# Patient Record
Sex: Female | Born: 1945
Health system: Southern US, Community
[De-identification: ages and names within clinical notes are randomized; demographics above are authoritative.]

## PROBLEM LIST (undated history)

## (undated) DIAGNOSIS — E119 Type 2 diabetes mellitus without complications: Secondary | ICD-10-CM

## (undated) DIAGNOSIS — O2230 Deep phlebothrombosis in pregnancy, unspecified trimester: Secondary | ICD-10-CM

## (undated) HISTORY — DX: Deep phlebothrombosis in pregnancy, unspecified trimester: O22.30

## (undated) HISTORY — DX: Type 2 diabetes mellitus without complications: E11.9

## (undated) HISTORY — PX: CHOLECYSTECTOMY: SHX55

---

## 2019-04-07 DIAGNOSIS — E782 Mixed hyperlipidemia: Secondary | ICD-10-CM | POA: Diagnosis not present

## 2019-04-07 DIAGNOSIS — E1159 Type 2 diabetes mellitus with other circulatory complications: Secondary | ICD-10-CM | POA: Diagnosis not present

## 2019-04-15 DIAGNOSIS — E1169 Type 2 diabetes mellitus with other specified complication: Secondary | ICD-10-CM | POA: Diagnosis not present

## 2019-04-15 DIAGNOSIS — Z139 Encounter for screening, unspecified: Secondary | ICD-10-CM | POA: Diagnosis not present

## 2019-04-15 DIAGNOSIS — E782 Mixed hyperlipidemia: Secondary | ICD-10-CM | POA: Diagnosis not present

## 2019-04-15 DIAGNOSIS — E1159 Type 2 diabetes mellitus with other circulatory complications: Secondary | ICD-10-CM | POA: Diagnosis not present

## 2019-04-15 DIAGNOSIS — Z0000 Encounter for general adult medical examination without abnormal findings: Secondary | ICD-10-CM | POA: Diagnosis not present

## 2019-04-15 DIAGNOSIS — I10 Essential (primary) hypertension: Secondary | ICD-10-CM | POA: Diagnosis not present

## 2019-04-19 DIAGNOSIS — E1169 Type 2 diabetes mellitus with other specified complication: Secondary | ICD-10-CM | POA: Diagnosis not present

## 2019-04-19 DIAGNOSIS — I10 Essential (primary) hypertension: Secondary | ICD-10-CM | POA: Diagnosis not present

## 2019-04-19 DIAGNOSIS — E782 Mixed hyperlipidemia: Secondary | ICD-10-CM | POA: Diagnosis not present

## 2019-04-19 DIAGNOSIS — E1159 Type 2 diabetes mellitus with other circulatory complications: Secondary | ICD-10-CM | POA: Diagnosis not present

## 2019-06-10 DIAGNOSIS — E782 Mixed hyperlipidemia: Secondary | ICD-10-CM | POA: Diagnosis not present

## 2019-06-10 DIAGNOSIS — E1169 Type 2 diabetes mellitus with other specified complication: Secondary | ICD-10-CM | POA: Diagnosis not present

## 2019-06-10 DIAGNOSIS — I10 Essential (primary) hypertension: Secondary | ICD-10-CM | POA: Diagnosis not present

## 2019-06-10 DIAGNOSIS — E1159 Type 2 diabetes mellitus with other circulatory complications: Secondary | ICD-10-CM | POA: Diagnosis not present

## 2019-06-28 ENCOUNTER — Other Ambulatory Visit: Payer: Self-pay

## 2019-06-28 NOTE — Patient Outreach (Signed)
[facility]) Care Management  06/28/2019  Robin Nash 1946-09-12 [id number]   Medication Adherence call to [name] Compliant Voice message left with a call back number. [name] is showing past due on Pravastatin 40 mg under [name].  [place] Management Direct Dial [phone]  Fax [phone] Isom [email or web address]

## 2019-07-08 DIAGNOSIS — E1169 Type 2 diabetes mellitus with other specified complication: Secondary | ICD-10-CM | POA: Diagnosis not present

## 2019-07-08 DIAGNOSIS — E782 Mixed hyperlipidemia: Secondary | ICD-10-CM | POA: Diagnosis not present

## 2019-07-08 DIAGNOSIS — E1159 Type 2 diabetes mellitus with other circulatory complications: Secondary | ICD-10-CM | POA: Diagnosis not present

## 2019-07-11 DIAGNOSIS — E1159 Type 2 diabetes mellitus with other circulatory complications: Secondary | ICD-10-CM | POA: Diagnosis not present

## 2019-07-11 DIAGNOSIS — I10 Essential (primary) hypertension: Secondary | ICD-10-CM | POA: Diagnosis not present

## 2019-07-11 DIAGNOSIS — E782 Mixed hyperlipidemia: Secondary | ICD-10-CM | POA: Diagnosis not present

## 2019-07-11 DIAGNOSIS — E1169 Type 2 diabetes mellitus with other specified complication: Secondary | ICD-10-CM | POA: Diagnosis not present

## 2019-08-10 DIAGNOSIS — E782 Mixed hyperlipidemia: Secondary | ICD-10-CM | POA: Diagnosis not present

## 2019-08-10 DIAGNOSIS — E1169 Type 2 diabetes mellitus with other specified complication: Secondary | ICD-10-CM | POA: Diagnosis not present

## 2019-08-10 DIAGNOSIS — I10 Essential (primary) hypertension: Secondary | ICD-10-CM | POA: Diagnosis not present

## 2019-08-10 DIAGNOSIS — E1159 Type 2 diabetes mellitus with other circulatory complications: Secondary | ICD-10-CM | POA: Diagnosis not present

## 2019-09-09 DIAGNOSIS — E782 Mixed hyperlipidemia: Secondary | ICD-10-CM | POA: Diagnosis not present

## 2019-09-09 DIAGNOSIS — E1159 Type 2 diabetes mellitus with other circulatory complications: Secondary | ICD-10-CM | POA: Diagnosis not present

## 2019-09-09 DIAGNOSIS — E1169 Type 2 diabetes mellitus with other specified complication: Secondary | ICD-10-CM | POA: Diagnosis not present

## 2019-09-09 DIAGNOSIS — I10 Essential (primary) hypertension: Secondary | ICD-10-CM | POA: Diagnosis not present

## 2019-10-10 DIAGNOSIS — E1169 Type 2 diabetes mellitus with other specified complication: Secondary | ICD-10-CM | POA: Diagnosis not present

## 2019-10-10 DIAGNOSIS — E1159 Type 2 diabetes mellitus with other circulatory complications: Secondary | ICD-10-CM | POA: Diagnosis not present

## 2019-10-10 DIAGNOSIS — I10 Essential (primary) hypertension: Secondary | ICD-10-CM | POA: Diagnosis not present

## 2019-10-10 DIAGNOSIS — E782 Mixed hyperlipidemia: Secondary | ICD-10-CM | POA: Diagnosis not present

## 2019-11-09 DIAGNOSIS — I10 Essential (primary) hypertension: Secondary | ICD-10-CM | POA: Diagnosis not present

## 2019-11-09 DIAGNOSIS — E1169 Type 2 diabetes mellitus with other specified complication: Secondary | ICD-10-CM | POA: Diagnosis not present

## 2019-11-09 DIAGNOSIS — E782 Mixed hyperlipidemia: Secondary | ICD-10-CM | POA: Diagnosis not present

## 2019-11-09 DIAGNOSIS — E1159 Type 2 diabetes mellitus with other circulatory complications: Secondary | ICD-10-CM | POA: Diagnosis not present

## 2019-12-06 DIAGNOSIS — E1169 Type 2 diabetes mellitus with other specified complication: Secondary | ICD-10-CM | POA: Diagnosis not present

## 2019-12-09 DIAGNOSIS — E1169 Type 2 diabetes mellitus with other specified complication: Secondary | ICD-10-CM | POA: Diagnosis not present

## 2019-12-09 DIAGNOSIS — E1159 Type 2 diabetes mellitus with other circulatory complications: Secondary | ICD-10-CM | POA: Diagnosis not present

## 2019-12-09 DIAGNOSIS — I10 Essential (primary) hypertension: Secondary | ICD-10-CM | POA: Diagnosis not present

## 2019-12-09 DIAGNOSIS — E782 Mixed hyperlipidemia: Secondary | ICD-10-CM | POA: Diagnosis not present

## 2019-12-12 DIAGNOSIS — E1159 Type 2 diabetes mellitus with other circulatory complications: Secondary | ICD-10-CM | POA: Diagnosis not present

## 2019-12-12 DIAGNOSIS — E782 Mixed hyperlipidemia: Secondary | ICD-10-CM | POA: Diagnosis not present

## 2019-12-12 DIAGNOSIS — E1169 Type 2 diabetes mellitus with other specified complication: Secondary | ICD-10-CM | POA: Diagnosis not present

## 2019-12-12 DIAGNOSIS — I152 Hypertension secondary to endocrine disorders: Secondary | ICD-10-CM | POA: Diagnosis not present

## 2020-01-08 DIAGNOSIS — E782 Mixed hyperlipidemia: Secondary | ICD-10-CM | POA: Diagnosis not present

## 2020-01-08 DIAGNOSIS — E1169 Type 2 diabetes mellitus with other specified complication: Secondary | ICD-10-CM | POA: Diagnosis not present

## 2020-01-08 DIAGNOSIS — I152 Hypertension secondary to endocrine disorders: Secondary | ICD-10-CM | POA: Diagnosis not present

## 2020-01-08 DIAGNOSIS — E1159 Type 2 diabetes mellitus with other circulatory complications: Secondary | ICD-10-CM | POA: Diagnosis not present

## 2020-01-23 DIAGNOSIS — Z1231 Encounter for screening mammogram for malignant neoplasm of breast: Secondary | ICD-10-CM | POA: Diagnosis not present

## 2020-02-08 DIAGNOSIS — E1169 Type 2 diabetes mellitus with other specified complication: Secondary | ICD-10-CM | POA: Diagnosis not present

## 2020-02-08 DIAGNOSIS — E1159 Type 2 diabetes mellitus with other circulatory complications: Secondary | ICD-10-CM | POA: Diagnosis not present

## 2020-02-08 DIAGNOSIS — E782 Mixed hyperlipidemia: Secondary | ICD-10-CM | POA: Diagnosis not present

## 2020-02-08 DIAGNOSIS — I152 Hypertension secondary to endocrine disorders: Secondary | ICD-10-CM | POA: Diagnosis not present

## 2020-03-09 DIAGNOSIS — E782 Mixed hyperlipidemia: Secondary | ICD-10-CM | POA: Diagnosis not present

## 2020-03-09 DIAGNOSIS — E1169 Type 2 diabetes mellitus with other specified complication: Secondary | ICD-10-CM | POA: Diagnosis not present

## 2020-03-09 DIAGNOSIS — I152 Hypertension secondary to endocrine disorders: Secondary | ICD-10-CM | POA: Diagnosis not present

## 2020-03-09 DIAGNOSIS — E1159 Type 2 diabetes mellitus with other circulatory complications: Secondary | ICD-10-CM | POA: Diagnosis not present

## 2020-03-12 DIAGNOSIS — Z0100 Encounter for examination of eyes and vision without abnormal findings: Secondary | ICD-10-CM | POA: Diagnosis not present

## 2020-03-12 DIAGNOSIS — E119 Type 2 diabetes mellitus without complications: Secondary | ICD-10-CM | POA: Diagnosis not present

## 2020-03-13 DIAGNOSIS — Z7984 Long term (current) use of oral hypoglycemic drugs: Secondary | ICD-10-CM | POA: Diagnosis not present

## 2020-03-13 DIAGNOSIS — E119 Type 2 diabetes mellitus without complications: Secondary | ICD-10-CM | POA: Diagnosis not present

## 2020-03-13 DIAGNOSIS — H2513 Age-related nuclear cataract, bilateral: Secondary | ICD-10-CM | POA: Diagnosis not present

## 2020-03-30 DIAGNOSIS — E1159 Type 2 diabetes mellitus with other circulatory complications: Secondary | ICD-10-CM | POA: Diagnosis not present

## 2020-03-30 DIAGNOSIS — E782 Mixed hyperlipidemia: Secondary | ICD-10-CM | POA: Diagnosis not present

## 2020-04-09 DIAGNOSIS — E782 Mixed hyperlipidemia: Secondary | ICD-10-CM | POA: Diagnosis not present

## 2020-04-09 DIAGNOSIS — E1159 Type 2 diabetes mellitus with other circulatory complications: Secondary | ICD-10-CM | POA: Diagnosis not present

## 2020-04-09 DIAGNOSIS — E1169 Type 2 diabetes mellitus with other specified complication: Secondary | ICD-10-CM | POA: Diagnosis not present

## 2020-04-09 DIAGNOSIS — I152 Hypertension secondary to endocrine disorders: Secondary | ICD-10-CM | POA: Diagnosis not present

## 2020-04-12 DIAGNOSIS — Z136 Encounter for screening for cardiovascular disorders: Secondary | ICD-10-CM | POA: Diagnosis not present

## 2020-04-12 DIAGNOSIS — E1159 Type 2 diabetes mellitus with other circulatory complications: Secondary | ICD-10-CM | POA: Diagnosis not present

## 2020-04-12 DIAGNOSIS — E1169 Type 2 diabetes mellitus with other specified complication: Secondary | ICD-10-CM | POA: Diagnosis not present

## 2020-04-12 DIAGNOSIS — Z0000 Encounter for general adult medical examination without abnormal findings: Secondary | ICD-10-CM | POA: Diagnosis not present

## 2020-04-12 DIAGNOSIS — I152 Hypertension secondary to endocrine disorders: Secondary | ICD-10-CM | POA: Diagnosis not present

## 2020-04-12 DIAGNOSIS — E782 Mixed hyperlipidemia: Secondary | ICD-10-CM | POA: Diagnosis not present

## 2020-04-12 DIAGNOSIS — Z7189 Other specified counseling: Secondary | ICD-10-CM | POA: Diagnosis not present

## 2020-04-12 DIAGNOSIS — Z139 Encounter for screening, unspecified: Secondary | ICD-10-CM | POA: Diagnosis not present

## 2020-05-09 DIAGNOSIS — I152 Hypertension secondary to endocrine disorders: Secondary | ICD-10-CM | POA: Diagnosis not present

## 2020-05-09 DIAGNOSIS — E1169 Type 2 diabetes mellitus with other specified complication: Secondary | ICD-10-CM | POA: Diagnosis not present

## 2020-05-09 DIAGNOSIS — E782 Mixed hyperlipidemia: Secondary | ICD-10-CM | POA: Diagnosis not present

## 2020-05-09 DIAGNOSIS — E1159 Type 2 diabetes mellitus with other circulatory complications: Secondary | ICD-10-CM | POA: Diagnosis not present

## 2020-06-10 DIAGNOSIS — E1169 Type 2 diabetes mellitus with other specified complication: Secondary | ICD-10-CM | POA: Diagnosis not present

## 2020-06-10 DIAGNOSIS — E1159 Type 2 diabetes mellitus with other circulatory complications: Secondary | ICD-10-CM | POA: Diagnosis not present

## 2020-06-10 DIAGNOSIS — I152 Hypertension secondary to endocrine disorders: Secondary | ICD-10-CM | POA: Diagnosis not present

## 2020-06-10 DIAGNOSIS — E782 Mixed hyperlipidemia: Secondary | ICD-10-CM | POA: Diagnosis not present

## 2020-07-10 DIAGNOSIS — I152 Hypertension secondary to endocrine disorders: Secondary | ICD-10-CM | POA: Diagnosis not present

## 2020-07-10 DIAGNOSIS — E1159 Type 2 diabetes mellitus with other circulatory complications: Secondary | ICD-10-CM | POA: Diagnosis not present

## 2020-07-10 DIAGNOSIS — E1169 Type 2 diabetes mellitus with other specified complication: Secondary | ICD-10-CM | POA: Diagnosis not present

## 2020-07-10 DIAGNOSIS — E782 Mixed hyperlipidemia: Secondary | ICD-10-CM | POA: Diagnosis not present

## 2020-07-11 DIAGNOSIS — E1169 Type 2 diabetes mellitus with other specified complication: Secondary | ICD-10-CM | POA: Diagnosis not present

## 2020-07-11 DIAGNOSIS — I152 Hypertension secondary to endocrine disorders: Secondary | ICD-10-CM | POA: Diagnosis not present

## 2020-07-11 DIAGNOSIS — E782 Mixed hyperlipidemia: Secondary | ICD-10-CM | POA: Diagnosis not present

## 2020-07-11 DIAGNOSIS — E1159 Type 2 diabetes mellitus with other circulatory complications: Secondary | ICD-10-CM | POA: Diagnosis not present

## 2020-07-12 DIAGNOSIS — E1169 Type 2 diabetes mellitus with other specified complication: Secondary | ICD-10-CM | POA: Diagnosis not present

## 2020-07-23 DIAGNOSIS — N1831 Chronic kidney disease, stage 3a: Secondary | ICD-10-CM | POA: Diagnosis not present

## 2020-07-23 DIAGNOSIS — E1169 Type 2 diabetes mellitus with other specified complication: Secondary | ICD-10-CM | POA: Diagnosis not present

## 2020-07-23 DIAGNOSIS — I152 Hypertension secondary to endocrine disorders: Secondary | ICD-10-CM | POA: Diagnosis not present

## 2020-07-23 DIAGNOSIS — E1159 Type 2 diabetes mellitus with other circulatory complications: Secondary | ICD-10-CM | POA: Diagnosis not present

## 2020-07-23 DIAGNOSIS — E782 Mixed hyperlipidemia: Secondary | ICD-10-CM | POA: Diagnosis not present

## 2020-08-10 DIAGNOSIS — E782 Mixed hyperlipidemia: Secondary | ICD-10-CM | POA: Diagnosis not present

## 2020-08-10 DIAGNOSIS — E1169 Type 2 diabetes mellitus with other specified complication: Secondary | ICD-10-CM | POA: Diagnosis not present

## 2020-08-10 DIAGNOSIS — I152 Hypertension secondary to endocrine disorders: Secondary | ICD-10-CM | POA: Diagnosis not present

## 2020-08-10 DIAGNOSIS — E1159 Type 2 diabetes mellitus with other circulatory complications: Secondary | ICD-10-CM | POA: Diagnosis not present

## 2020-09-10 DIAGNOSIS — E1159 Type 2 diabetes mellitus with other circulatory complications: Secondary | ICD-10-CM | POA: Diagnosis not present

## 2020-09-10 DIAGNOSIS — I152 Hypertension secondary to endocrine disorders: Secondary | ICD-10-CM | POA: Diagnosis not present

## 2020-09-10 DIAGNOSIS — E1169 Type 2 diabetes mellitus with other specified complication: Secondary | ICD-10-CM | POA: Diagnosis not present

## 2020-09-10 DIAGNOSIS — E782 Mixed hyperlipidemia: Secondary | ICD-10-CM | POA: Diagnosis not present

## 2020-10-10 DIAGNOSIS — E782 Mixed hyperlipidemia: Secondary | ICD-10-CM | POA: Diagnosis not present

## 2020-10-10 DIAGNOSIS — E1159 Type 2 diabetes mellitus with other circulatory complications: Secondary | ICD-10-CM | POA: Diagnosis not present

## 2020-10-10 DIAGNOSIS — I152 Hypertension secondary to endocrine disorders: Secondary | ICD-10-CM | POA: Diagnosis not present

## 2020-10-10 DIAGNOSIS — E1169 Type 2 diabetes mellitus with other specified complication: Secondary | ICD-10-CM | POA: Diagnosis not present

## 2020-11-08 DIAGNOSIS — U071 COVID-19: Secondary | ICD-10-CM | POA: Diagnosis not present

## 2020-11-08 DIAGNOSIS — Z20822 Contact with and (suspected) exposure to covid-19: Secondary | ICD-10-CM | POA: Diagnosis not present

## 2020-12-11 DIAGNOSIS — N1831 Chronic kidney disease, stage 3a: Secondary | ICD-10-CM | POA: Diagnosis not present

## 2020-12-11 DIAGNOSIS — I152 Hypertension secondary to endocrine disorders: Secondary | ICD-10-CM | POA: Diagnosis not present

## 2020-12-11 DIAGNOSIS — E782 Mixed hyperlipidemia: Secondary | ICD-10-CM | POA: Diagnosis not present

## 2020-12-11 DIAGNOSIS — E1169 Type 2 diabetes mellitus with other specified complication: Secondary | ICD-10-CM | POA: Diagnosis not present

## 2021-01-08 DIAGNOSIS — I152 Hypertension secondary to endocrine disorders: Secondary | ICD-10-CM | POA: Diagnosis not present

## 2021-01-08 DIAGNOSIS — E1169 Type 2 diabetes mellitus with other specified complication: Secondary | ICD-10-CM | POA: Diagnosis not present

## 2021-01-08 DIAGNOSIS — N1831 Chronic kidney disease, stage 3a: Secondary | ICD-10-CM | POA: Diagnosis not present

## 2021-01-08 DIAGNOSIS — E782 Mixed hyperlipidemia: Secondary | ICD-10-CM | POA: Diagnosis not present

## 2021-02-08 DIAGNOSIS — E1159 Type 2 diabetes mellitus with other circulatory complications: Secondary | ICD-10-CM | POA: Diagnosis not present

## 2021-02-08 DIAGNOSIS — E1169 Type 2 diabetes mellitus with other specified complication: Secondary | ICD-10-CM | POA: Diagnosis not present

## 2021-02-08 DIAGNOSIS — E785 Hyperlipidemia, unspecified: Secondary | ICD-10-CM | POA: Diagnosis not present

## 2021-03-10 DIAGNOSIS — E1169 Type 2 diabetes mellitus with other specified complication: Secondary | ICD-10-CM | POA: Diagnosis not present

## 2021-03-10 DIAGNOSIS — E782 Mixed hyperlipidemia: Secondary | ICD-10-CM | POA: Diagnosis not present

## 2021-03-10 DIAGNOSIS — E1159 Type 2 diabetes mellitus with other circulatory complications: Secondary | ICD-10-CM | POA: Diagnosis not present

## 2021-03-14 DIAGNOSIS — E1169 Type 2 diabetes mellitus with other specified complication: Secondary | ICD-10-CM | POA: Diagnosis not present

## 2021-03-18 DIAGNOSIS — R748 Abnormal levels of other serum enzymes: Secondary | ICD-10-CM | POA: Diagnosis not present

## 2021-03-18 DIAGNOSIS — E1169 Type 2 diabetes mellitus with other specified complication: Secondary | ICD-10-CM | POA: Diagnosis not present

## 2021-03-18 DIAGNOSIS — E782 Mixed hyperlipidemia: Secondary | ICD-10-CM | POA: Diagnosis not present

## 2021-03-18 DIAGNOSIS — N1831 Chronic kidney disease, stage 3a: Secondary | ICD-10-CM | POA: Diagnosis not present

## 2021-03-18 DIAGNOSIS — R1011 Right upper quadrant pain: Secondary | ICD-10-CM | POA: Diagnosis not present

## 2021-03-21 DIAGNOSIS — R109 Unspecified abdominal pain: Secondary | ICD-10-CM | POA: Diagnosis not present

## 2021-03-21 DIAGNOSIS — R1011 Right upper quadrant pain: Secondary | ICD-10-CM | POA: Diagnosis not present

## 2021-03-28 DIAGNOSIS — R748 Abnormal levels of other serum enzymes: Secondary | ICD-10-CM | POA: Diagnosis not present

## 2021-03-28 DIAGNOSIS — R160 Hepatomegaly, not elsewhere classified: Secondary | ICD-10-CM | POA: Diagnosis not present

## 2021-03-28 DIAGNOSIS — I959 Hypotension, unspecified: Secondary | ICD-10-CM | POA: Diagnosis not present

## 2021-03-28 DIAGNOSIS — E1169 Type 2 diabetes mellitus with other specified complication: Secondary | ICD-10-CM | POA: Diagnosis not present

## 2021-04-02 DIAGNOSIS — K8689 Other specified diseases of pancreas: Secondary | ICD-10-CM | POA: Diagnosis not present

## 2021-04-02 DIAGNOSIS — K7689 Other specified diseases of liver: Secondary | ICD-10-CM | POA: Diagnosis not present

## 2021-04-02 DIAGNOSIS — R109 Unspecified abdominal pain: Secondary | ICD-10-CM | POA: Diagnosis not present

## 2021-04-02 DIAGNOSIS — R160 Hepatomegaly, not elsewhere classified: Secondary | ICD-10-CM | POA: Diagnosis not present

## 2021-04-02 DIAGNOSIS — C799 Secondary malignant neoplasm of unspecified site: Secondary | ICD-10-CM | POA: Diagnosis not present

## 2021-04-04 ENCOUNTER — Telehealth: Payer: Self-pay

## 2021-04-04 DIAGNOSIS — R160 Hepatomegaly, not elsewhere classified: Secondary | ICD-10-CM | POA: Diagnosis not present

## 2021-04-04 DIAGNOSIS — I824Z1 Acute embolism and thrombosis of unspecified deep veins of right distal lower extremity: Secondary | ICD-10-CM | POA: Diagnosis not present

## 2021-04-04 DIAGNOSIS — I82441 Acute embolism and thrombosis of right tibial vein: Secondary | ICD-10-CM | POA: Diagnosis not present

## 2021-04-04 DIAGNOSIS — Z6822 Body mass index (BMI) 22.0-22.9, adult: Secondary | ICD-10-CM | POA: Diagnosis not present

## 2021-04-04 DIAGNOSIS — R978 Other abnormal tumor markers: Secondary | ICD-10-CM | POA: Diagnosis not present

## 2021-04-04 DIAGNOSIS — M79661 Pain in right lower leg: Secondary | ICD-10-CM | POA: Diagnosis not present

## 2021-04-04 DIAGNOSIS — R970 Elevated carcinoembryonic antigen [CEA]: Secondary | ICD-10-CM | POA: Diagnosis not present

## 2021-04-04 DIAGNOSIS — R600 Localized edema: Secondary | ICD-10-CM | POA: Diagnosis not present

## 2021-04-04 DIAGNOSIS — K8689 Other specified diseases of pancreas: Secondary | ICD-10-CM | POA: Diagnosis not present

## 2021-04-04 DIAGNOSIS — M7989 Other specified soft tissue disorders: Secondary | ICD-10-CM | POA: Diagnosis not present

## 2021-04-04 NOTE — Telephone Encounter (Signed)
LVM. Appointment for patient is 6-2 at 830am in [facility] and that she needs to arrive at 815

## 2021-04-05 NOTE — Telephone Encounter (Signed)
Patient called back and is aware of appt information.

## 2021-04-10 DIAGNOSIS — E782 Mixed hyperlipidemia: Secondary | ICD-10-CM | POA: Diagnosis not present

## 2021-04-10 DIAGNOSIS — E1169 Type 2 diabetes mellitus with other specified complication: Secondary | ICD-10-CM | POA: Diagnosis not present

## 2021-04-10 DIAGNOSIS — N1831 Chronic kidney disease, stage 3a: Secondary | ICD-10-CM | POA: Diagnosis not present

## 2021-04-11 ENCOUNTER — Ambulatory Visit (INDEPENDENT_AMBULATORY_CARE_PROVIDER_SITE_OTHER): Payer: Medicare HMO | Admitting: Gastroenterology

## 2021-04-11 ENCOUNTER — Encounter: Payer: Self-pay | Admitting: Gastroenterology

## 2021-04-11 VITALS — BP 132/72 | HR 88 | Ht 64.0 in | Wt 140.5 lb

## 2021-04-11 DIAGNOSIS — I8290 Acute embolism and thrombosis of unspecified vein: Secondary | ICD-10-CM

## 2021-04-11 DIAGNOSIS — C787 Secondary malignant neoplasm of liver and intrahepatic bile duct: Secondary | ICD-10-CM | POA: Diagnosis not present

## 2021-04-11 DIAGNOSIS — E1169 Type 2 diabetes mellitus with other specified complication: Secondary | ICD-10-CM | POA: Diagnosis not present

## 2021-04-11 DIAGNOSIS — C259 Malignant neoplasm of pancreas, unspecified: Secondary | ICD-10-CM | POA: Diagnosis not present

## 2021-04-11 DIAGNOSIS — I82409 Acute embolism and thrombosis of unspecified deep veins of unspecified lower extremity: Secondary | ICD-10-CM | POA: Diagnosis not present

## 2021-04-11 NOTE — Progress Notes (Signed)
Chief Complaint: Abnormal MRI  Referring Provider:  Marco Collie, MD      ASSESSMENT AND PLAN;   #1. Likely Stage IV pancreatic Ca (junction of body/tail of pancreas) with liver/LN mets.  Markedly elevated CA 19-9.  No biliary obstruction.  #2.  New onset DVT on Xeralo  Plan: -IR guided liver Bx ASAP, off Xeralto x 48hrs (CKD3), if/when OK with Dr Nyra Capes.  Not sure if she would benefit from IVC filter placement. -For oncology, once liver Bx is back, she would like to see Dr Bobby Rumpf at [place] of on [facility].  I have called Alfredo Bach (pt daughter) and have discussed the above findings extensively.  We have also discussed further work-up, poor prognosis and further treatment.  Fortunately, Altha Harm is in process of moving to [place] from [place].  I am certain that will help Bernisha a lot. I have also discussed with Dr. Marco Collie over text messaging. HPI:    Robin Nash is a 75 y.o. female  Seen as emergency workin for abn MRI. With HTN, HLD, DM2, DVT, CKD3  Had new onset abn LFTs with disproportionately increased alk phos (417, AST 41, ALT 59, TB 0.4) 03/20/2021 prompting RUQ US showing heterogeneous liver mass followed by MRI abdomen with contrast showing -Numerous aggressive appearing hepatic lesions likely metastatic disease -Poorly defined hypervascular lesion at the junction of body/tail of pancreas with prominent PD dilatation highly s/o primary pancreatic neoplasm. -Portacaval LNs suspicious for metastatic LN disease.  Her CA 19-9 was significantly elevated at 160070 U/ml (N 0-35) and CEA 108 (N 0-4.7).  Unfortunately, had leg swelling requiring Doppler ultrasound last week 04/04/2021 which is positive for right DVT.  She has been started on Xarelto.  She also complains of left leg swelling.  No SOB  No jaundice dark urine or pale stools.  CBD was normal on MRI.  She had had profound weight loss of over 40lb over last 3 mts.  She  also has new onset DM a year ago.  No nausea, vomiting, heartburn, regurgitation, odynophagia or dysphagia.  No significant diarrhea or constipation. No melena or hematochezia. No abdominal pain.    Past GI work-up: (Followed by Dr. Marian Sorrow in [place]) -Colon 03/2013 [facility], Dr Marian Sorrow): small 3 mm polyp s/p polypectomy.  Otherwise normal. -S/P lap chole 03/2016  Past Medical History:  Diagnosis Date  . Diabetes ([place])   . DVT (deep vein thrombosis) in pregnancy     Family History  Problem Relation Age of Onset  . Heart disease Brother   . Diabetes Maternal Aunt   . Colon cancer Neg Hx   . Esophageal cancer Neg Hx   . Liver disease Neg Hx     Social History   Tobacco Use  . Smoking status: Former Smoker    Years: 45.00  . Smokeless tobacco: Never Used  Vaping Use  . Vaping Use: Never used  Substance Use Topics  . Alcohol use: Yes    Comment: occ wine  . Drug use: Never    Current Outpatient Medications  Medication Sig Dispense Refill  . acetaminophen (TYLENOL) 650 MG CR tablet Take 650 mg by mouth as needed for pain.    Marland Kitchen telmisartan (MICARDIS) 20 MG tablet Take 20 mg by mouth daily.    . pravastatin (PRAVACHOL) 40 MG tablet Take 40 mg by mouth daily.     No current facility-administered medications for this visit.    No Known Allergies  Review  of Systems:  Constitutional: Denies fever, chills, diaphoresis, appetite change and has fatigue.  HEENT: Negative.   Respiratory: Denies SOB, DOE, cough, chest tightness,  and wheezing.   Cardiovascular: Denies chest pain, palpitations and leg swelling.  Genitourinary: Denies dysuria, urgency, frequency, hematuria, flank pain and difficulty urinating.  Musculoskeletal: Denies myalgias, back pain, joint swelling, arthralgias and gait problem.  Skin: No rash.  Neurological: Denies dizziness, seizures, syncope, weakness, light-headedness, numbness and headaches.  Hematological: Denies adenopathy. Easy bruising, personal  or family bleeding history  Psychiatric/Behavioral: No anxiety or depression     Physical Exam:    BP 132/72   Pulse 88   Ht 5' 4" (1.626 m)   Wt 140 lb 8 oz (63.7 kg)   SpO2 98%   BMI 24.12 kg/m  Wt Readings from Last 3 Encounters:  04/11/21 140 lb 8 oz (63.7 kg)   Constitutional: Thin built, in no acute distress. Psychiatric: Normal mood and affect. Behavior is normal. HEENT: Pupils normal.  Conjunctivae are normal. No scleral icterus. Cardiovascular: Normal rate, regular rhythm. No edema Pulmonary/chest: Effort normal and breath sounds normal. No wheezing, rales or rhonchi. Abdominal: Soft, nondistended. Nontender. Bowel sounds active throughout. There are no masses palpable. No hepatomegaly. Rectal: Deferred Neurological: Alert and oriented to person place and time. Skin: Skin is warm and dry. No rashes noted.  Data Reviewed: I have personally reviewed following labs and imaging studies  Labs reviewed -03/20/2021: Glucose 153, creatinine 1.09 with GFR 54 mL/min.  Sodium 143 potassium 3.6 calcium 10.1 albumin 4.3 total bilirubin 0.4, alkaline phosphatase 417, AST 41, ALT 59, GGT 757 -Amylase 71 lipase 56 -WBC count 10.9, hemoglobin 12.5, MCV 87, platelets 300 K -CA 19-9 160 K, CEA 108   Carmell Austria, MD 04/11/2021, 8:51 AM Cc: Marco Collie, MD

## 2021-04-11 NOTE — Patient Instructions (Signed)
If you are age 74 or older, your body mass index should be between 23-30. Your Body mass index is 24.12 kg/m. If this is out of the aforementioned range listed, please consider follow up with your Primary Care Provider.  If you are age 69 or younger, your body mass index should be between 19-25. Your Body mass index is 24.12 kg/m. If this is out of the aformentioned range listed, please consider follow up with your Primary Care Provider.   __________________________________________________________  The [facility] providers would like to encourage you to use [facility] to communicate with providers for non-urgent requests or questions.  Due to long hold times on the telephone, sending your provider a message by [facility] may be a faster and more efficient way to get a response.  Please allow 48 business hours for a response.  Please remember that this is for non-urgent requests.   We will need to get a clearance from your PCP before we can do your Liver Biopsy.  Please call us in 1 week if you haven't heard anything from [place] at [phone].  Thank you,  Dr. [name]

## 2021-04-12 ENCOUNTER — Encounter (HOSPITAL_COMMUNITY): Payer: Self-pay | Admitting: Radiology

## 2021-04-12 ENCOUNTER — Other Ambulatory Visit: Payer: Self-pay | Admitting: Radiology

## 2021-04-12 NOTE — Progress Notes (Signed)
Robin Nash Female, 75 y.o., 1946-04-23  MRN:  [id number] Phone:  [phone] (H)       PCP:  Marco Collie, MD Coverage:  [facility] Medicare/[employer] Medicare Hmo  Next Appt With Radiology ([facility]-US 2) 04/15/2021 at 1:00 PM           RE: [place] CORE BIOPSY (LIVER) Received: Virgia Land, MD  Bennye Alm,   -It should be okay to hold Xarelto for 2 doses before. Let me run it by Dr. Marco Collie as well. I got few more lab results and her GFR is 57 mL/min.   -Also she may benefit from IVC filter placement. Let me run that by Dr. Nyra Capes as well. Not sure if it can be done at the time of liver biopsy.   -Please send liver biopsy as [facility]. So we can have results sooner.   -Also send copy of liver biopsy to Dr. Lavera Guise (oncology at [facility])   Thanks for your help.   RG        Previous Messages   ----- Message -----  From: [name] D  Sent: 04/11/2021  1:06 PM EDT  To: [name], MD  Subject: FW: [place] CORE BIOPSY (LIVER)            Patient is on Xarelto and will need to hold two doses which end up being just one day of no Xarelto prior to her biopsy. Please advise if it is okay to hold. Thanks Aniceto Boss  ----- Message -----  From: [name] D  Sent: 04/11/2021  1:04 PM EDT  To: Ir Procedure Requests  Subject: [place] CORE BIOPSY (LIVER)              Procedure: [place] CORE BIOPSY (LIVER)   Reason: Deep vein thrombosis (DVT) of non-extremity vein, unspecified chronicity  Malignant neoplasm of pancreas, unspecified location of malignancy, pancreatic cancer, DVT,  Also do a IR guided ultrasound liver biospy please   History: outside imaging in PAC's, done at [facility]   Provider: [name]   Provider Contact:  [phone]

## 2021-04-12 NOTE — Progress Notes (Signed)
Robin Nash. [place] Female, 75 y.o., 05/31/1946  MRN:  [id number] Phone:  [phone] (H)       PCP:  Marco Collie, MD Coverage:  [facility] Medicare/[employer] Medicare Hmo  Next Appt With Radiology ([facility]-US 2) 04/15/2021 at 1:00 PM           RE: [place] CORE BIOPSY (LIVER) Received: [name], MD  [name]   [place] core liver lesion  R/o met panc ca   DDH        Previous Messages   ----- Message -----  From: [name] D  Sent: 04/11/2021  1:04 PM EDT  To: Ir Procedure Requests  Subject: [place] CORE BIOPSY (LIVER)              Procedure: [place] CORE BIOPSY (LIVER)   Reason: Deep vein thrombosis (DVT) of non-extremity vein, unspecified chronicity  Malignant neoplasm of pancreas, unspecified location of malignancy, pancreatic cancer, DVT,  Also do a IR guided ultrasound liver biospy please   History: outside imaging in PAC's, done at [facility]   Provider: [name]   Provider Contact:  [phone]

## 2021-04-15 ENCOUNTER — Encounter (HOSPITAL_COMMUNITY): Payer: Self-pay

## 2021-04-15 ENCOUNTER — Ambulatory Visit (HOSPITAL_COMMUNITY)
Admission: RE | Admit: 2021-04-15 | Discharge: 2021-04-15 | Disposition: A | Discharge status: Home / Self Care | Payer: Medicare HMO | Source: Ambulatory Visit | Attending: Gastroenterology | Admitting: Gastroenterology

## 2021-04-15 ENCOUNTER — Other Ambulatory Visit: Payer: Self-pay

## 2021-04-15 DIAGNOSIS — K7689 Other specified diseases of liver: Secondary | ICD-10-CM | POA: Diagnosis not present

## 2021-04-15 DIAGNOSIS — C259 Malignant neoplasm of pancreas, unspecified: Secondary | ICD-10-CM

## 2021-04-15 DIAGNOSIS — C787 Secondary malignant neoplasm of liver and intrahepatic bile duct: Secondary | ICD-10-CM | POA: Insufficient documentation

## 2021-04-15 DIAGNOSIS — I8290 Acute embolism and thrombosis of unspecified vein: Secondary | ICD-10-CM | POA: Diagnosis not present

## 2021-04-15 DIAGNOSIS — C229 Malignant neoplasm of liver, not specified as primary or secondary: Secondary | ICD-10-CM | POA: Diagnosis not present

## 2021-04-15 LAB — CBC WITH DIFFERENTIAL/PLATELET
Abs Immature Granulocytes: 0.11 10*3/uL — ABNORMAL HIGH (ref 0.00–0.07)
Basophils Absolute: 0.1 10*3/uL (ref 0.0–0.1)
Basophils Relative: 0 %
Eosinophils Absolute: 0.2 10*3/uL (ref 0.0–0.5)
Eosinophils Relative: 1 %
HCT: 32.1 % — ABNORMAL LOW (ref 36.0–46.0)
Hemoglobin: 10.1 g/dL — ABNORMAL LOW (ref 12.0–15.0)
Immature Granulocytes: 1 %
Lymphocytes Relative: 17 %
Lymphs Abs: 2.9 10*3/uL (ref 0.7–4.0)
MCH: 26.5 pg (ref 26.0–34.0)
MCHC: 31.5 g/dL (ref 30.0–36.0)
MCV: 84.3 fL (ref 80.0–100.0)
Monocytes Absolute: 1.7 10*3/uL — ABNORMAL HIGH (ref 0.1–1.0)
Monocytes Relative: 10 %
Neutro Abs: 12.2 10*3/uL — ABNORMAL HIGH (ref 1.7–7.7)
Neutrophils Relative %: 71 %
Platelets: 341 10*3/uL (ref 150–400)
RBC: 3.81 MIL/uL — ABNORMAL LOW (ref 3.87–5.11)
RDW: 16.2 % — ABNORMAL HIGH (ref 11.5–15.5)
WBC: 17.2 10*3/uL — ABNORMAL HIGH (ref 4.0–10.5)
nRBC: 0 % (ref 0.0–0.2)

## 2021-04-15 LAB — PROTIME-INR
INR: 1.2 (ref 0.8–1.2)
Prothrombin Time: 14.7 seconds (ref 11.4–15.2)

## 2021-04-15 LAB — COMPREHENSIVE METABOLIC PANEL
ALT: 57 U/L — ABNORMAL HIGH (ref 0–44)
AST: 48 U/L — ABNORMAL HIGH (ref 15–41)
Albumin: 3.5 g/dL (ref 3.5–5.0)
Alkaline Phosphatase: 323 U/L — ABNORMAL HIGH (ref 38–126)
Anion gap: 11 (ref 5–15)
BUN: 15 mg/dL (ref 8–23)
CO2: 23 mmol/L (ref 22–32)
Calcium: 9.8 mg/dL (ref 8.9–10.3)
Chloride: 105 mmol/L (ref 98–111)
Creatinine, Ser: 0.83 mg/dL (ref 0.44–1.00)
GFR, Estimated: >60 mL/min (ref >60)
Glucose, Bld: 108 mg/dL — ABNORMAL HIGH (ref 70–99)
Potassium: 3.5 mmol/L (ref 3.5–5.1)
Sodium: 139 mmol/L (ref 135–145)
Total Bilirubin: 1.1 mg/dL (ref 0.3–1.2)
Total Protein: 7.9 g/dL (ref 6.5–8.1)

## 2021-04-15 LAB — GLUCOSE, CAPILLARY: Glucose-Capillary: 102 mg/dL — ABNORMAL HIGH (ref 70–99)

## 2021-04-15 MED ORDER — FENTANYL CITRATE (PF) 100 MCG/2ML IJ SOLN
INTRAMUSCULAR | Status: AC
  Filled 2021-04-15: qty 2

## 2021-04-15 MED ORDER — HYDROCODONE-ACETAMINOPHEN 5-325 MG PO TABS: 1.0000 | ORAL_TABLET | ORAL | Status: DC | PRN

## 2021-04-15 MED ORDER — LIDOCAINE HCL 1 % IJ SOLN
INTRAMUSCULAR | Status: AC
  Filled 2021-04-15: qty 20

## 2021-04-15 MED ORDER — SODIUM CHLORIDE 0.9 % IV SOLN: INTRAVENOUS | Status: DC

## 2021-04-15 MED ORDER — LIDOCAINE HCL (PF) 1 % IJ SOLN
INTRAMUSCULAR | Status: AC | PRN
  Administered 2021-04-15: 10 mL

## 2021-04-15 MED ORDER — FENTANYL CITRATE (PF) 100 MCG/2ML IJ SOLN
INTRAMUSCULAR | Status: AC | PRN
  Administered 2021-04-15 (×2): 50 ug via INTRAVENOUS

## 2021-04-15 MED ORDER — GELATIN ABSORBABLE 12-7 MM EX MISC
CUTANEOUS | Status: AC
  Filled 2021-04-15: qty 1

## 2021-04-15 MED ORDER — MIDAZOLAM HCL 2 MG/2ML IJ SOLN
INTRAMUSCULAR | Status: AC
  Filled 2021-04-15: qty 2

## 2021-04-15 MED ORDER — MIDAZOLAM HCL 2 MG/2ML IJ SOLN
INTRAMUSCULAR | Status: AC | PRN
  Administered 2021-04-15 (×2): 1 mg via INTRAVENOUS

## 2021-04-15 NOTE — Consult Note (Signed)
Chief Complaint: Patient was seen in consultation today for image guided liver lesion biopsy  Referring Physician(s): [place]  Supervising Physician: Jacqulynn Cadet  Patient Status: [facility] - Out-pt  History of Present Illness: Robin Nash is a 75 y.o. female ex-smoker, with past medical history of hypertension, hyperlipidemia, diabetes, lower extremity DVT, chronic kidney disease, who presents now with elevated LFTs and outside imaging revealing numerous liver lesions with poorly defined hypervascular lesion of the junction of the body/tail of the pancreas with prominent pancreatic ductal dilatation, enlarged portacaval lymph nodes suspicious for metastatic disease.  Patient also has significantly elevated CA 19-9 as well as elevated CEA.  She is scheduled today for image guided liver lesion biopsy for further evaluation.   Past Medical History:  Diagnosis Date  . Diabetes ([place])   . DVT (deep vein thrombosis) in pregnancy   Prior lap chole in 2017, colon polypectomy 2014   Allergies: Patient has no known allergies.  Medications: Prior to Admission medications   Medication Sig Start Date End Date Taking? Authorizing Provider  acetaminophen (TYLENOL) 650 MG CR tablet Take 650 mg by mouth as needed for pain.    [provider]  Cholecalciferol (VITAMIN D3) 50 MCG (2000 UT) TABS Take 50 mcg by mouth daily.    [provider]  empagliflozin (JARDIANCE) 25 MG TABS tablet Take 25 mg by mouth daily.    [provider]  pravastatin (PRAVACHOL) 40 MG tablet Take 40 mg by mouth daily.    [provider]  rivaroxaban (XARELTO) 20 MG TABS tablet Take 20 mg by mouth 2 (two) times daily.    [provider]  telmisartan (MICARDIS) 20 MG tablet Take 20 mg by mouth daily.    [provider]     Family History  Problem Relation Age of Onset  . Heart disease Brother   . Diabetes Maternal Aunt   . Colon cancer Neg Hx   .  Esophageal cancer Neg Hx   . Liver disease Neg Hx     Social History   Socioeconomic History  . Marital status: Single    Spouse name: Not on file  . Number of children: 4  . Years of education: Not on file  . Highest education level: Not on file  Occupational History  . Occupation: retired  Tobacco Use  . Smoking status: Former Smoker    Years: 45.00  . Smokeless tobacco: Never Used  Vaping Use  . Vaping Use: Never used  Substance and Sexual Activity  . Alcohol use: Yes    Comment: occ wine  . Drug use: Never  . Sexual activity: Not on file  Other Topics Concern  . Not on file  Social History Narrative  . Not on file   Social Determinants of Health   Financial Resource Strain: Not on file  Food Insecurity: Not on file  Transportation Needs: Not on file  Physical Activity: Not on file  Stress: Not on file  Social Connections: Not on file      Review of Systems denies fever, headache, chest pain, dyspnea, cough, back pain, nausea, vomiting or bleeding.  She does have some abdominal pain and weight loss  Vital Signs: BP (!) 151/82   Pulse 92   Temp 98.6 F (37 C) (Oral)   Resp 16   SpO2 100%   Physical Exam awake, alert.  Chest clear to auscultation bilaterally.  Heart with regular rate and rhythm, abdomen soft, positive bowel sounds, mildly tender epigastric  and left lower quadrant regions to palpation; no significant lower extremity edema.   Imaging: No results found.  Labs:  CBC: No results for input(s): WBC, HGB, HCT, PLT in the last 8760 hours.  COAGS: No results for input(s): INR, APTT in the last 8760 hours.  BMP: No results for input(s): NA, K, CL, CO2, GLUCOSE, BUN, CALCIUM, CREATININE, GFRNONAA, GFRAA in the last 8760 hours.  Invalid input(s): CMP  LIVER FUNCTION TESTS: No results for input(s): BILITOT, AST, ALT, ALKPHOS, PROT, ALBUMIN in the last 8760 hours.  TUMOR MARKERS: No results for input(s): AFPTM, CEA, CA199, CHROMGRNA in the  last 8760 hours.  Assessment and Plan: 75 y.o. female ex-smoker, with past medical history of hypertension, hyperlipidemia, diabetes, lower extremity DVT, chronic kidney disease, who presents now with elevated LFTs and outside imaging revealing numerous liver lesions with poorly defined hypervascular lesion of the junction of the body/tail of the pancreas with prominent pancreatic ductal dilatation, enlarged portacaval lymph nodes suspicious for metastatic disease.  Patient also has significantly elevated CA 19-9 as well as elevated CEA.  She is scheduled today for image guided liver lesion biopsy for further evaluation.Risks and benefits of procedure was discussed with the patient  including, but not limited to bleeding, infection, damage to adjacent structures or low yield requiring additional tests.  All of the questions were answered and there is agreement to proceed.  Consent signed and in chart.     Thank you for this interesting consult.  I greatly enjoyed meeting Robin Nash and look forward to participating in their care.  A copy of this report was sent to the requesting provider on this date.  Electronically Signed: D. Rowe Robert, PA-C 04/15/2021, 11:49 AM   I spent a total of 25 minutes    in face to face in clinical consultation, greater than 50% of which was counseling/coordinating care for image guided liver lesion biopsy

## 2021-04-15 NOTE — Procedures (Signed)
Interventional Radiology Procedure Note  Procedure: US guided core biopsy of liver lesion   Complications: None  Estimated Blood Loss: None  Recommendations: - Path sent - Bedrest x 2 hrs - DC home   Signed,  Criselda Peaches, MD

## 2021-04-15 NOTE — Discharge Instructions (Signed)
For questions /concerns may call Interventional Radiology at [phone]  You may remove your dressing and shower tomorrow afternoon      Liver Biopsy, Care After These instructions give you information on caring for yourself after your procedure. Your doctor may also give you more specific instructions. Call your doctor if you have any problems or questions after your procedure. What can I expect after the procedure? After the procedure, it is common to have:  Pain and soreness where the biopsy was done.  Bruising around the area where the biopsy was done.  Sleepiness and be tired for a few days. Follow these instructions at home: Medicines  Take over-the-counter and prescription medicines only as told by your doctor.  If you were prescribed an antibiotic medicine, take it as told by your doctor. Do not stop taking the antibiotic even if you start to feel better.  Do not take medicines such as aspirin and ibuprofen. These medicines can thin your blood. Do not take these medicines unless your doctor tells you to take them.  If you are taking prescription pain medicine, take actions to prevent or treat constipation. Your doctor may recommend that you: ? Drink enough fluid to keep your pee (urine) clear or pale yellow. ? Take over-the-counter or prescription medicines. ? Eat foods that are high in fiber, such as fresh fruits and vegetables, whole grains, and beans. ? Limit foods that are high in fat and processed sugars, such as fried and sweet foods. Caring for your cut  Follow instructions from your doctor about how to take care of your cuts from surgery (incisions). Make sure you: ? Wash your hands with soap and water before you change your bandage (dressing). If you cannot use soap and water, use hand sanitizer. ? Change your bandage as told by your doctor. ? Leave stitches (sutures), skin glue, or skin tape (adhesive) strips in place. They may need to stay in place for 2 weeks  or longer. If tape strips get loose and curl up, you may trim the loose edges. Do not remove tape strips completely unless your doctor says it is okay.  Check your cuts every day for signs of infection. Check for: ? Redness, swelling, or more pain. ? Fluid or blood. ? Pus or a bad smell. ? Warmth.  Do not take baths, swim, or use a hot tub until your doctor says it is okay to do so. Activity  Rest at home for 1-2 days or as told by your doctor. ? Avoid sitting for a long time without moving. Get up to take short walks every 1-2 hours.  Return to your normal activities as told by your doctor. Ask what activities are safe for you.  Do not do these things in the first 24 hours: ? Drive. ? Use machinery. ? Take a bath or shower.  Do not lift more than 10 pounds (4.5 kg) or play contact sports for the first 2 weeks.   General instructions  Do not drink alcohol in the first week after the procedure.  Have someone stay with you for at least 24 hours after the procedure.  Get your test results. Ask your doctor or the department that is doing the test: ? When will my results be ready? ? How will I get my results? ? What are my treatment options? ? What other tests do I need? ? What are my next steps?  Keep all follow-up visits as told by your doctor. This is important.  Contact a doctor if:  A cut bleeds and leaves more than just a small spot of blood.  A cut is red, puffs up (swells), or hurts more than before.  Fluid or something else comes from a cut.  A cut smells bad.  You have a fever or chills. Get help right away if:  You have swelling, bloating, or pain in your belly (abdomen).  You get dizzy or faint.  You have a rash.  You feel sick to your stomach (nauseous) or throw up (vomit).  You have trouble breathing, feel short of breath, or feel faint.  Your chest hurts.  You have problems talking or seeing.  You have trouble with your balance or moving your  arms or legs. Summary  After the procedure, it is common to have pain, soreness, bruising, and tiredness.  Your doctor will tell you how to take care of yourself at home. Change your bandage, take your medicines, and limit your activities as told by your doctor.  Call your doctor if you have symptoms of infection. Get help right away if your belly swells, your cut bleeds a lot, or you have trouble talking or breathing. This information is not intended to replace advice given to you by your health care provider. Make sure you discuss any questions you have with your health care provider.    Moderate Conscious Sedation, Adult, Care After This sheet gives you information about how to care for yourself after your procedure. Your health care provider may also give you more specific instructions. If you have problems or questions, contact your health care provider. What can I expect after the procedure? After the procedure, it is common to have:  Sleepiness for several hours.  Impaired judgment for several hours.  Difficulty with balance.  Vomiting if you eat too soon. Follow these instructions at home: For the time period you were told by your health care provider:  Rest.  Do not participate in activities where you could fall or become injured.  Do not drive or use machinery.  Do not drink alcohol.  Do not take sleeping pills or medicines that cause drowsiness.  Do not make important decisions or sign legal documents.  Do not take care of children on your own.      Eating and drinking  Follow the diet recommended by your health care provider.  Drink enough fluid to keep your urine pale yellow.  If you vomit: ? Drink water, juice, or soup when you can drink without vomiting. ? Make sure you have little or no nausea before eating solid foods.   General instructions  Take over-the-counter and prescription medicines only as told by your health care provider.  Have a  responsible adult stay with you for the time you are told. It is important to have someone help care for you until you are awake and alert.  Do not smoke.  Keep all follow-up visits as told by your health care provider. This is important. Contact a health care provider if:  You are still sleepy or having trouble with balance after 24 hours.  You feel light-headed.  You keep feeling nauseous or you keep vomiting.  You develop a rash.  You have a fever.  You have redness or swelling around the IV site. Get help right away if:  You have trouble breathing.  You have new-onset confusion at home. Summary  After the procedure, it is common to feel sleepy, have impaired judgment, or feel nauseous if  you eat too soon.  Rest after you get home. Know the things you should not do after the procedure.  Follow the diet recommended by your health care provider and drink enough fluid to keep your urine pale yellow.  Get help right away if you have trouble breathing or new-onset confusion at home. This information is not intended to replace advice given to you by your health care provider. Make sure you discuss any questions you have with your health care provider. Document Revised: 02/24/2020 Document Reviewed: 09/22/2019 Elsevier Patient Education  2021 [employer].

## 2021-04-17 LAB — SURGICAL PATHOLOGY

## 2021-04-18 ENCOUNTER — Telehealth: Payer: Self-pay | Admitting: Gastroenterology

## 2021-04-18 NOTE — Telephone Encounter (Signed)
Patient daughter calling wanting to discuss liver biopsy results.. Plz advise  thanks

## 2021-04-18 NOTE — Telephone Encounter (Signed)
Pts daughter is requesting a call from Dr. Lyndel Safe regarding liver bx result. Let her know he is out of the office but message would be sent to him requesting a call back.

## 2021-04-22 ENCOUNTER — Telehealth: Payer: Self-pay

## 2021-04-22 ENCOUNTER — Telehealth: Payer: Self-pay | Admitting: Oncology

## 2021-04-22 ENCOUNTER — Telehealth: Payer: Self-pay | Admitting: Gastroenterology

## 2021-04-22 ENCOUNTER — Other Ambulatory Visit: Payer: Self-pay

## 2021-04-22 DIAGNOSIS — R4020 Unspecified coma: Secondary | ICD-10-CM | POA: Diagnosis not present

## 2021-04-22 DIAGNOSIS — C229 Malignant neoplasm of liver, not specified as primary or secondary: Secondary | ICD-10-CM | POA: Diagnosis not present

## 2021-04-22 DIAGNOSIS — R0902 Hypoxemia: Secondary | ICD-10-CM | POA: Diagnosis not present

## 2021-04-22 DIAGNOSIS — J439 Emphysema, unspecified: Secondary | ICD-10-CM | POA: Diagnosis not present

## 2021-04-22 DIAGNOSIS — E785 Hyperlipidemia, unspecified: Secondary | ICD-10-CM | POA: Diagnosis not present

## 2021-04-22 DIAGNOSIS — I4901 Ventricular fibrillation: Secondary | ICD-10-CM | POA: Diagnosis not present

## 2021-04-22 DIAGNOSIS — R42 Dizziness and giddiness: Secondary | ICD-10-CM | POA: Diagnosis not present

## 2021-04-22 DIAGNOSIS — R61 Generalized hyperhidrosis: Secondary | ICD-10-CM | POA: Diagnosis not present

## 2021-04-22 DIAGNOSIS — I468 Cardiac arrest due to other underlying condition: Secondary | ICD-10-CM | POA: Diagnosis not present

## 2021-04-22 DIAGNOSIS — I82409 Acute embolism and thrombosis of unspecified deep veins of unspecified lower extremity: Secondary | ICD-10-CM | POA: Diagnosis not present

## 2021-04-22 DIAGNOSIS — Z6822 Body mass index (BMI) 22.0-22.9, adult: Secondary | ICD-10-CM | POA: Diagnosis not present

## 2021-04-22 DIAGNOSIS — I10 Essential (primary) hypertension: Secondary | ICD-10-CM | POA: Diagnosis not present

## 2021-04-22 DIAGNOSIS — I499 Cardiac arrhythmia, unspecified: Secondary | ICD-10-CM | POA: Diagnosis not present

## 2021-04-22 DIAGNOSIS — C259 Malignant neoplasm of pancreas, unspecified: Secondary | ICD-10-CM

## 2021-04-22 DIAGNOSIS — R197 Diarrhea, unspecified: Secondary | ICD-10-CM | POA: Diagnosis not present

## 2021-04-22 DIAGNOSIS — R404 Transient alteration of awareness: Secondary | ICD-10-CM | POA: Diagnosis not present

## 2021-04-22 DIAGNOSIS — R110 Nausea: Secondary | ICD-10-CM | POA: Diagnosis not present

## 2021-04-22 DIAGNOSIS — I8290 Acute embolism and thrombosis of unspecified vein: Secondary | ICD-10-CM

## 2021-04-22 DIAGNOSIS — J939 Pneumothorax, unspecified: Secondary | ICD-10-CM | POA: Diagnosis not present

## 2021-04-22 DIAGNOSIS — I2510 Atherosclerotic heart disease of native coronary artery without angina pectoris: Secondary | ICD-10-CM | POA: Diagnosis not present

## 2021-04-22 DIAGNOSIS — C787 Secondary malignant neoplasm of liver and intrahepatic bile duct: Secondary | ICD-10-CM | POA: Diagnosis not present

## 2021-04-22 DIAGNOSIS — R55 Syncope and collapse: Secondary | ICD-10-CM | POA: Diagnosis not present

## 2021-04-22 DIAGNOSIS — J9690 Respiratory failure, unspecified, unspecified whether with hypoxia or hypercapnia: Secondary | ICD-10-CM | POA: Diagnosis not present

## 2021-04-22 DIAGNOSIS — S2241XA Multiple fractures of ribs, right side, initial encounter for closed fracture: Secondary | ICD-10-CM | POA: Diagnosis not present

## 2021-04-22 DIAGNOSIS — K5900 Constipation, unspecified: Secondary | ICD-10-CM | POA: Diagnosis not present

## 2021-04-22 DIAGNOSIS — Z20822 Contact with and (suspected) exposure to covid-19: Secondary | ICD-10-CM | POA: Diagnosis not present

## 2021-04-22 DIAGNOSIS — I469 Cardiac arrest, cause unspecified: Secondary | ICD-10-CM | POA: Diagnosis not present

## 2021-04-22 DIAGNOSIS — K219 Gastro-esophageal reflux disease without esophagitis: Secondary | ICD-10-CM | POA: Diagnosis not present

## 2021-04-22 DIAGNOSIS — R000 Tachycardia, unspecified: Secondary | ICD-10-CM | POA: Diagnosis not present

## 2021-04-22 NOTE — Telephone Encounter (Signed)
Inbound call from pt's daughter requesting a call back. Pt has been vomiting. Please advise

## 2021-04-22 NOTE — Telephone Encounter (Signed)
Per Jerene Pitch from Dr [name] - Patient referred for Pancreatic CA.  Appt made for 04/25/21 Labs 3:00 pm - Consult 3:30 pm  Waiting return call from patient to confirm Appt

## 2021-04-22 NOTE — Progress Notes (Signed)
Referral sent to Dr Bobby Rumpf and spoke to scheduler [place]

## 2021-04-22 NOTE — Telephone Encounter (Signed)
Left message for pt's daughter to call back.

## 2021-04-22 NOTE — Telephone Encounter (Signed)
Per Tommy Rainwater. With [facility] Medicare patient doesn't need an auth for her upcoming appointment Dr Bobby Rumpf on 6-16 since [place] doesn't require it for office visits. Ref # [id number]

## 2021-04-23 DIAGNOSIS — N179 Acute kidney failure, unspecified: Secondary | ICD-10-CM | POA: Diagnosis not present

## 2021-04-23 DIAGNOSIS — Z515 Encounter for palliative care: Secondary | ICD-10-CM | POA: Diagnosis not present

## 2021-04-23 DIAGNOSIS — C799 Secondary malignant neoplasm of unspecified site: Secondary | ICD-10-CM | POA: Diagnosis not present

## 2021-04-23 DIAGNOSIS — C259 Malignant neoplasm of pancreas, unspecified: Secondary | ICD-10-CM | POA: Diagnosis not present

## 2021-04-23 DIAGNOSIS — G9389 Other specified disorders of brain: Secondary | ICD-10-CM | POA: Diagnosis not present

## 2021-04-23 DIAGNOSIS — I82409 Acute embolism and thrombosis of unspecified deep veins of unspecified lower extremity: Secondary | ICD-10-CM | POA: Diagnosis not present

## 2021-04-23 DIAGNOSIS — R918 Other nonspecific abnormal finding of lung field: Secondary | ICD-10-CM | POA: Diagnosis not present

## 2021-04-23 DIAGNOSIS — Z452 Encounter for adjustment and management of vascular access device: Secondary | ICD-10-CM | POA: Diagnosis not present

## 2021-04-23 DIAGNOSIS — J9691 Respiratory failure, unspecified with hypoxia: Secondary | ICD-10-CM | POA: Diagnosis not present

## 2021-04-23 DIAGNOSIS — J9690 Respiratory failure, unspecified, unspecified whether with hypoxia or hypercapnia: Secondary | ICD-10-CM | POA: Diagnosis not present

## 2021-04-23 DIAGNOSIS — I639 Cerebral infarction, unspecified: Secondary | ICD-10-CM | POA: Diagnosis not present

## 2021-04-23 DIAGNOSIS — I219 Acute myocardial infarction, unspecified: Secondary | ICD-10-CM | POA: Diagnosis not present

## 2021-04-23 DIAGNOSIS — I469 Cardiac arrest, cause unspecified: Secondary | ICD-10-CM | POA: Diagnosis not present

## 2021-04-23 DIAGNOSIS — E785 Hyperlipidemia, unspecified: Secondary | ICD-10-CM | POA: Diagnosis not present

## 2021-04-23 DIAGNOSIS — K219 Gastro-esophageal reflux disease without esophagitis: Secondary | ICD-10-CM | POA: Diagnosis not present

## 2021-04-23 DIAGNOSIS — R42 Dizziness and giddiness: Secondary | ICD-10-CM | POA: Diagnosis not present

## 2021-04-23 DIAGNOSIS — I499 Cardiac arrhythmia, unspecified: Secondary | ICD-10-CM | POA: Diagnosis not present

## 2021-04-23 DIAGNOSIS — I214 Non-ST elevation (NSTEMI) myocardial infarction: Secondary | ICD-10-CM | POA: Diagnosis not present

## 2021-04-23 DIAGNOSIS — I2119 ST elevation (STEMI) myocardial infarction involving other coronary artery of inferior wall: Secondary | ICD-10-CM | POA: Diagnosis not present

## 2021-04-23 DIAGNOSIS — J690 Pneumonitis due to inhalation of food and vomit: Secondary | ICD-10-CM | POA: Diagnosis not present

## 2021-04-23 DIAGNOSIS — Z66 Do not resuscitate: Secondary | ICD-10-CM | POA: Diagnosis not present

## 2021-04-23 DIAGNOSIS — J439 Emphysema, unspecified: Secondary | ICD-10-CM | POA: Diagnosis not present

## 2021-04-23 DIAGNOSIS — R569 Unspecified convulsions: Secondary | ICD-10-CM | POA: Diagnosis not present

## 2021-04-23 DIAGNOSIS — G931 Anoxic brain damage, not elsewhere classified: Secondary | ICD-10-CM | POA: Diagnosis not present

## 2021-04-23 DIAGNOSIS — R4182 Altered mental status, unspecified: Secondary | ICD-10-CM | POA: Diagnosis not present

## 2021-04-23 DIAGNOSIS — G9341 Metabolic encephalopathy: Secondary | ICD-10-CM | POA: Diagnosis not present

## 2021-04-23 DIAGNOSIS — I63513 Cerebral infarction due to unspecified occlusion or stenosis of bilateral middle cerebral arteries: Secondary | ICD-10-CM | POA: Diagnosis not present

## 2021-04-23 DIAGNOSIS — J811 Chronic pulmonary edema: Secondary | ICD-10-CM | POA: Diagnosis not present

## 2021-04-23 DIAGNOSIS — R000 Tachycardia, unspecified: Secondary | ICD-10-CM | POA: Diagnosis not present

## 2021-04-23 DIAGNOSIS — C779 Secondary and unspecified malignant neoplasm of lymph node, unspecified: Secondary | ICD-10-CM | POA: Diagnosis not present

## 2021-04-23 DIAGNOSIS — Z4682 Encounter for fitting and adjustment of non-vascular catheter: Secondary | ICD-10-CM | POA: Diagnosis not present

## 2021-04-23 DIAGNOSIS — C229 Malignant neoplasm of liver, not specified as primary or secondary: Secondary | ICD-10-CM | POA: Diagnosis not present

## 2021-04-23 DIAGNOSIS — R9401 Abnormal electroencephalogram [EEG]: Secondary | ICD-10-CM | POA: Diagnosis not present

## 2021-04-23 DIAGNOSIS — I517 Cardiomegaly: Secondary | ICD-10-CM | POA: Diagnosis not present

## 2021-04-23 DIAGNOSIS — J9601 Acute respiratory failure with hypoxia: Secondary | ICD-10-CM | POA: Diagnosis not present

## 2021-04-23 DIAGNOSIS — I4901 Ventricular fibrillation: Secondary | ICD-10-CM | POA: Diagnosis not present

## 2021-04-24 ENCOUNTER — Telehealth: Payer: Self-pay | Admitting: Oncology

## 2021-04-24 NOTE — Telephone Encounter (Signed)
Per patient's SGO - She is IP at [place]

## 2021-04-25 ENCOUNTER — Inpatient Hospital Stay: Payer: Medicare HMO

## 2021-04-25 ENCOUNTER — Inpatient Hospital Stay: Payer: Medicare HMO | Admitting: Oncology

## 2021-04-25 NOTE — Telephone Encounter (Signed)
Pt currently hospitalized.

## 2021-04-29 ENCOUNTER — Telehealth: Payer: Self-pay | Admitting: Oncology

## 2021-04-29 NOTE — Telephone Encounter (Signed)
Per patient's SGO, she is still IP at [place] at Dr Moses Manners of patients status

## 2021-05-10 DEATH — deceased

## 2021-07-26 IMAGING — US US BIOPSY CORE LIVER
1 series · 14 of 23 positions shown · non-contrast
Comparison: none

INDICATION: Pancreatic cancer with multiple liver lesions concerning for
metastatic disease. Patient presents for ultrasound-guided core
biopsy of the same.

[Series 1: us core biopsy (liver) mc & wl · 14 of 23 slices shown]
[im 1/23]
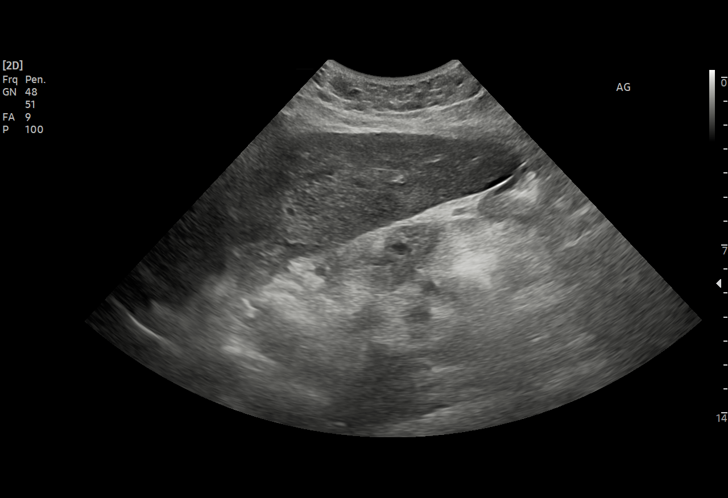
[im 3/23]
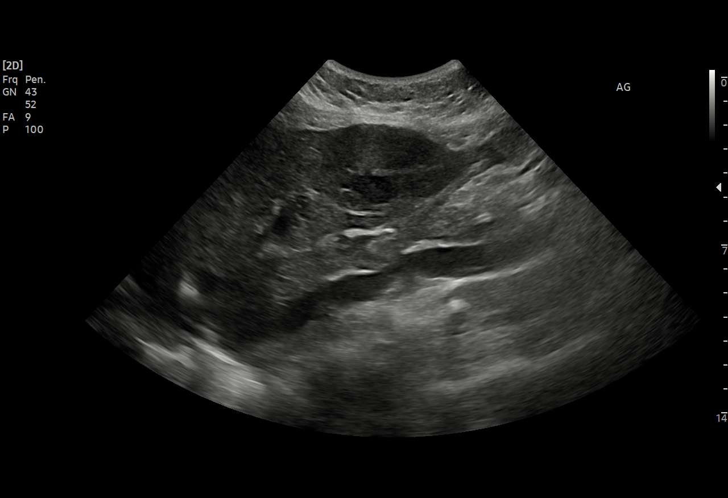
[im 5/23]
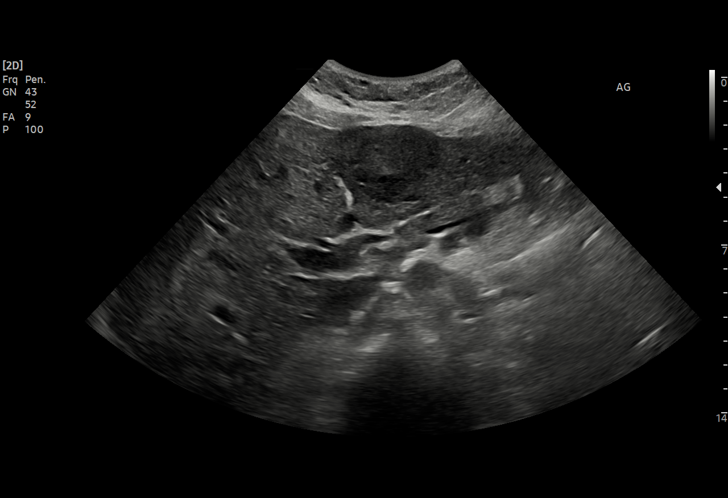
[im 6/23]
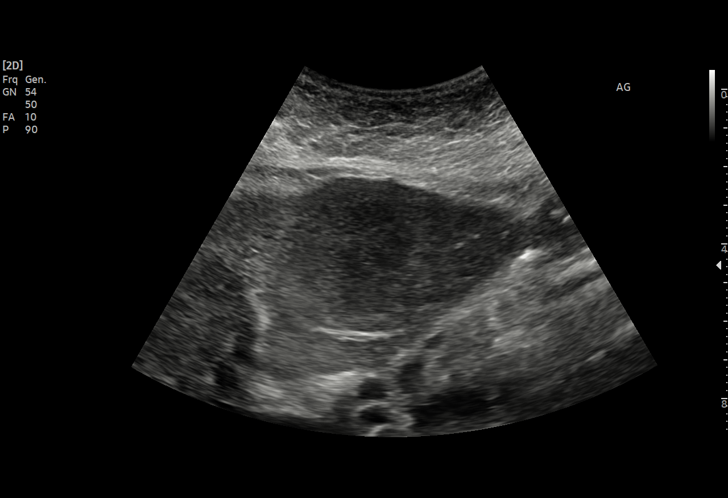
[im 8/23]
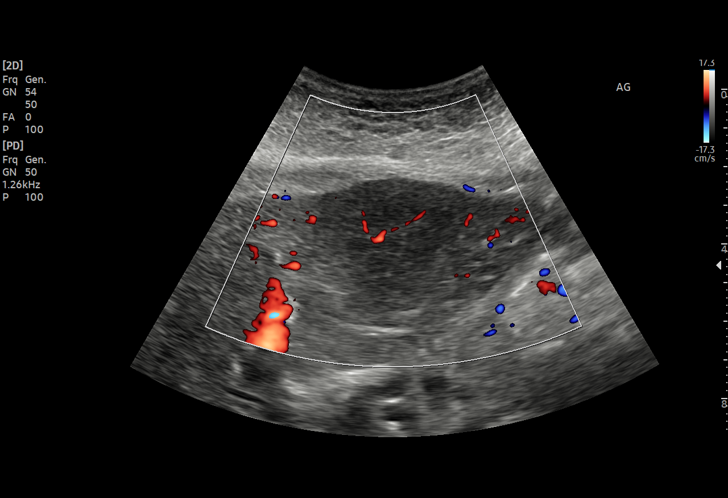
[im 10/23]
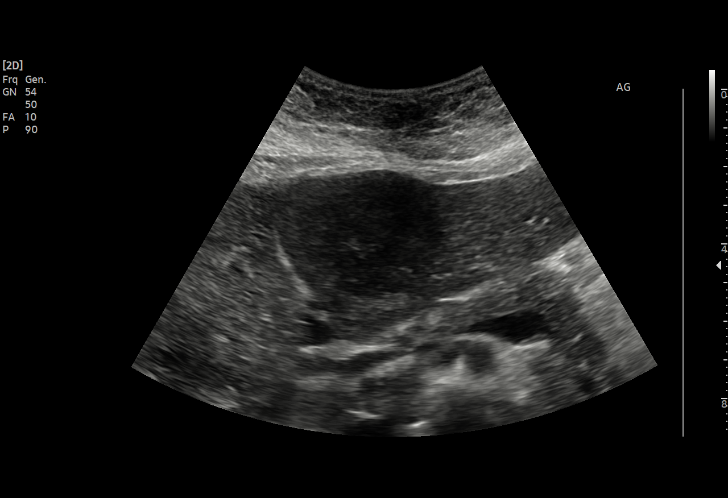
[im 11/23]
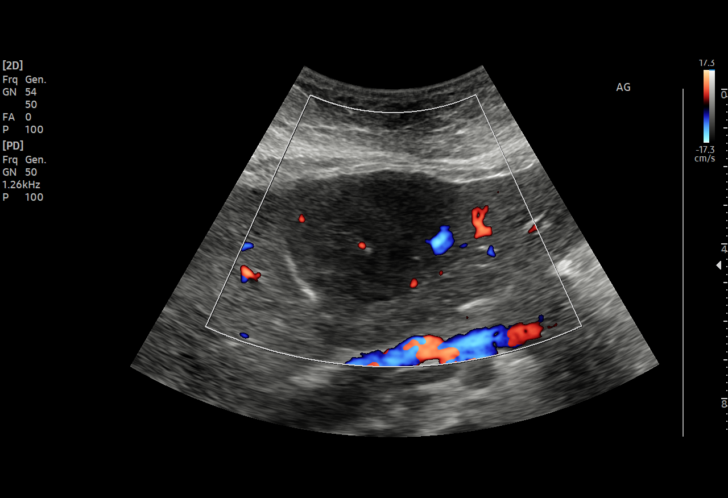
[im 13/23]
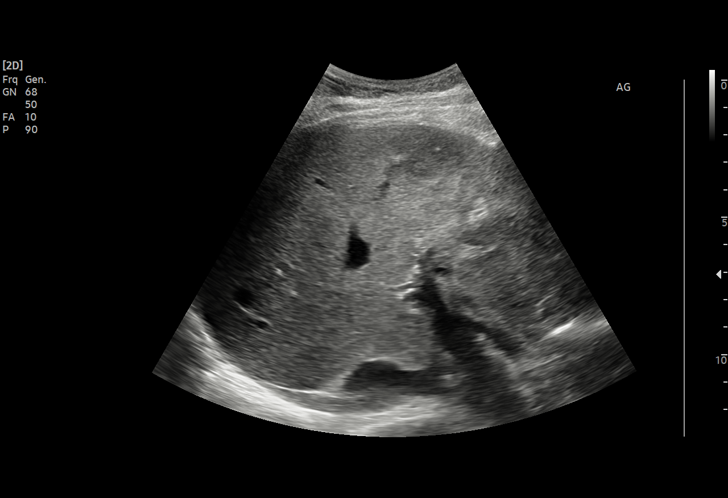
[im 14/23]
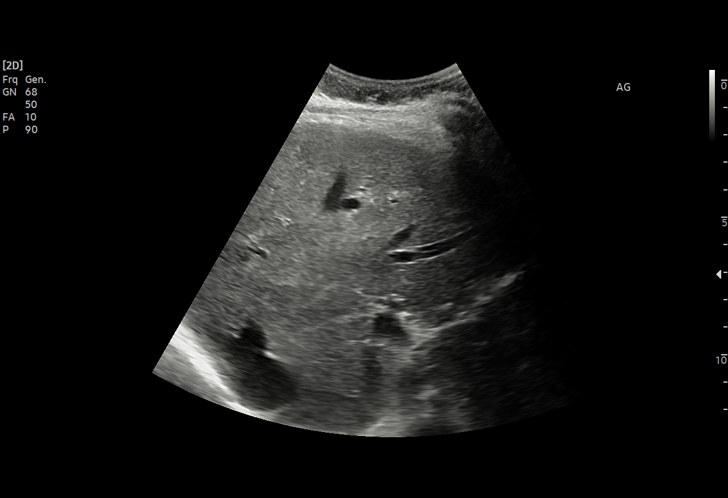
[im 16/23]
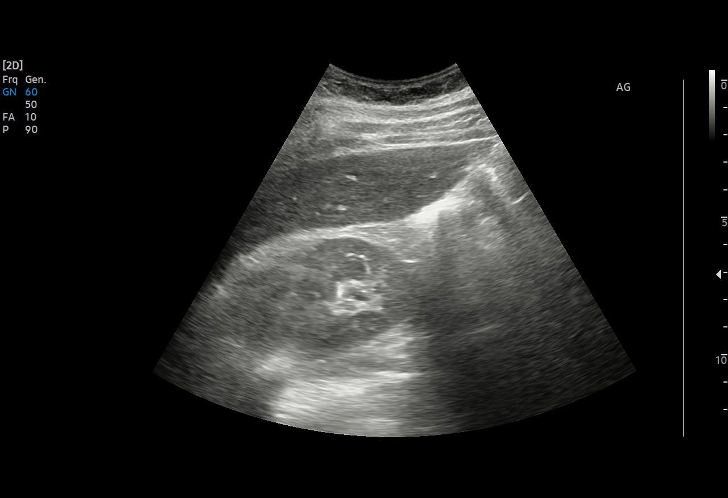
[im 18/23]
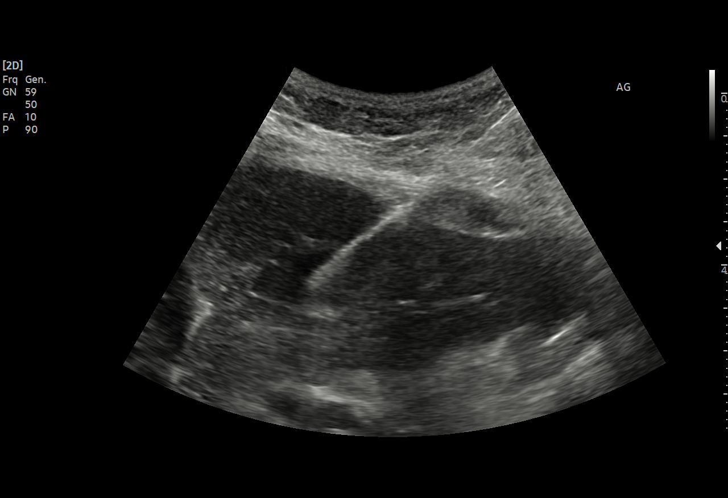
[im 19/23]
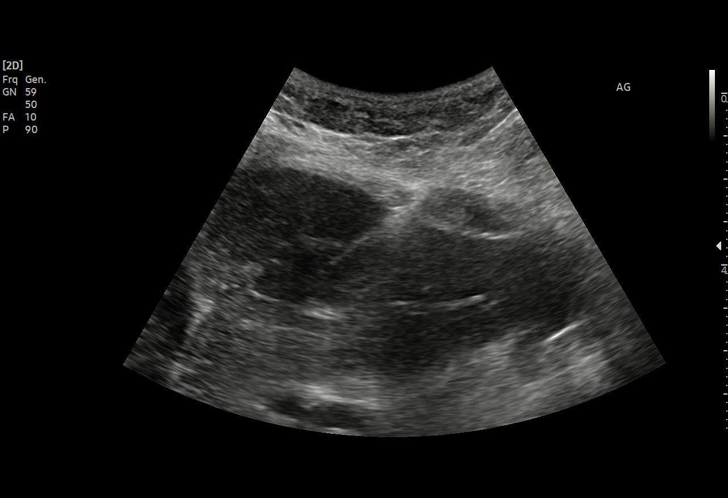
[im 21/23]
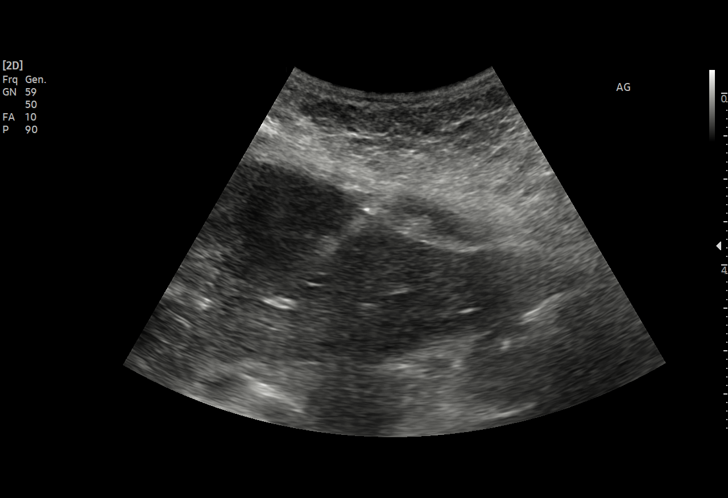
[im 23/23]
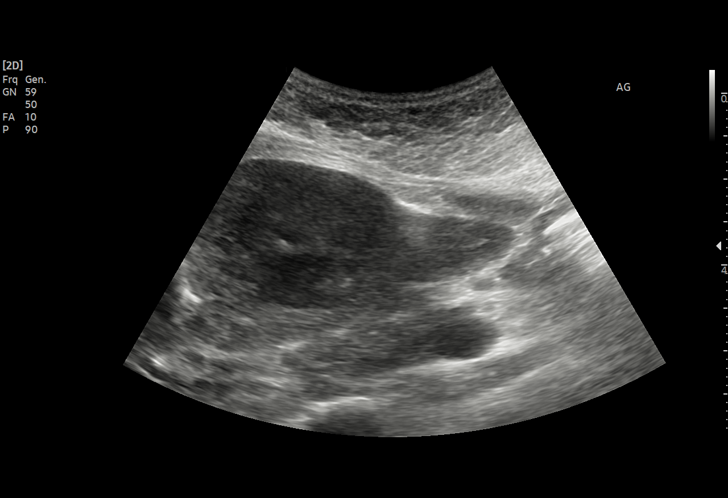

[14 of 23 positions shown; findings below may reference images not displayed]

EXAM:
ULTRASOUND BIOPSY CORE LIVER

MEDICATIONS:
None.

ANESTHESIA/SEDATION:
Moderate (conscious) sedation was employed during this procedure. A
total of Versed 2 mg and Fentanyl 100 mcg was administered
intravenously.

Moderate Sedation Time: 10 minutes. The patient's level of
consciousness and vital signs were monitored continuously by
radiology nursing throughout the procedure under my direct
supervision.

FLUOROSCOPY TIME:  None.

COMPLICATIONS:
None immediate.

PROCEDURE:
Informed written consent was obtained from the patient after a
thorough discussion of the procedural risks, benefits and
alternatives. All questions were addressed. Maximal Sterile Barrier
Technique was utilized including caps, mask, sterile gowns, sterile
gloves, sterile drape, hand hygiene and skin antiseptic. A timeout
was performed prior to the initiation of the procedure.

The right upper quadrant was interrogated with ultrasound. Multiple
hypoechoic hepatic lesions were identified. A suitable lesion in the
left hemi liver was identified. A suitable skin entry site was
selected and marked. The region was sterilely prepped and draped in
the standard fashion using chlorhexidine skin prep. Local anesthesia
was attained by infiltration with 1% lidocaine. A small dermatotomy
was made. Under real-time sonographic guidance, a 17 gauge
introducer needle was advanced into the margin of the mass. Multiple
18 gauge biopsies were then coaxially obtained using the [employer]
automated biopsy device. Biopsy samples were placed in formalin and
delivered to pathology for further analysis.

As a 17 gauge introducer needle was removed, the biopsy tract was
embolized with a Gel-Foam slurry. Post biopsy ultrasound imaging
demonstrates no evidence of active hemorrhage or perihepatic
hematoma. The patient tolerated the procedure well.
IMPRESSION: Technically successful ultrasound-guided core biopsy of hepatic
lesion.

## 2022-04-15 ENCOUNTER — Telehealth: Payer: Self-pay

## 2022-04-15 NOTE — Telephone Encounter (Signed)
Opened in error
